# Patient Record
Sex: Female | Born: 1950 | Race: White | Hispanic: No | Marital: Married | Smoking: Never smoker
Health system: Southern US, Community
[De-identification: ages and names within clinical notes are randomized; demographics above are authoritative.]

## PROBLEM LIST (undated history)

## (undated) DIAGNOSIS — M109 Gout, unspecified: Secondary | ICD-10-CM

## (undated) DIAGNOSIS — E119 Type 2 diabetes mellitus without complications: Secondary | ICD-10-CM

## (undated) DIAGNOSIS — R112 Nausea with vomiting, unspecified: Secondary | ICD-10-CM

## (undated) DIAGNOSIS — I10 Essential (primary) hypertension: Secondary | ICD-10-CM

## (undated) DIAGNOSIS — T8859XA Other complications of anesthesia, initial encounter: Secondary | ICD-10-CM

## (undated) DIAGNOSIS — K429 Umbilical hernia without obstruction or gangrene: Secondary | ICD-10-CM

## (undated) DIAGNOSIS — T4145XA Adverse effect of unspecified anesthetic, initial encounter: Secondary | ICD-10-CM

## (undated) DIAGNOSIS — Z9889 Other specified postprocedural states: Secondary | ICD-10-CM

## (undated) HISTORY — PX: CYST EXCISION: SHX5701

## (undated) HISTORY — PX: ABDOMINAL HYSTERECTOMY: SHX81

## (undated) HISTORY — PX: APPENDECTOMY: SHX54

## (undated) HISTORY — PX: TONSILLECTOMY: SUR1361

---

## 2010-04-17 ENCOUNTER — Ambulatory Visit: Payer: Self-pay | Admitting: Family Medicine

## 2010-06-10 ENCOUNTER — Ambulatory Visit: Payer: Self-pay | Admitting: Gastroenterology

## 2010-11-19 ENCOUNTER — Ambulatory Visit: Payer: Self-pay | Admitting: Family Medicine

## 2010-11-19 ENCOUNTER — Ambulatory Visit: Payer: Self-pay | Admitting: Surgery

## 2010-11-21 LAB — PATHOLOGY REPORT

## 2011-05-25 ENCOUNTER — Ambulatory Visit: Payer: Self-pay | Admitting: Unknown Physician Specialty

## 2011-10-14 ENCOUNTER — Ambulatory Visit: Payer: Self-pay | Admitting: Unknown Physician Specialty

## 2013-03-01 ENCOUNTER — Ambulatory Visit: Payer: Self-pay | Admitting: Neurology

## 2013-03-01 LAB — CREATININE, SERUM: EGFR (Non-African Amer.): >60

## 2015-05-22 ENCOUNTER — Other Ambulatory Visit: Payer: Self-pay | Admitting: Family Medicine

## 2015-05-23 ENCOUNTER — Other Ambulatory Visit: Payer: Self-pay | Admitting: Family Medicine

## 2015-05-23 DIAGNOSIS — R1032 Left lower quadrant pain: Secondary | ICD-10-CM

## 2015-05-28 ENCOUNTER — Ambulatory Visit
Admission: RE | Admit: 2015-05-28 | Discharge: 2015-05-28 | Disposition: A | Discharge status: Home / Self Care | Payer: BLUE CROSS/BLUE SHIELD | Source: Ambulatory Visit | Attending: Family Medicine | Admitting: Family Medicine

## 2015-05-28 DIAGNOSIS — K429 Umbilical hernia without obstruction or gangrene: Secondary | ICD-10-CM | POA: Insufficient documentation

## 2015-05-28 DIAGNOSIS — K439 Ventral hernia without obstruction or gangrene: Secondary | ICD-10-CM | POA: Insufficient documentation

## 2015-05-28 DIAGNOSIS — K5730 Diverticulosis of large intestine without perforation or abscess without bleeding: Secondary | ICD-10-CM | POA: Insufficient documentation

## 2015-05-28 DIAGNOSIS — R1032 Left lower quadrant pain: Secondary | ICD-10-CM | POA: Diagnosis present

## 2015-05-28 HISTORY — DX: Type 2 diabetes mellitus without complications: E11.9

## 2015-05-28 HISTORY — DX: Essential (primary) hypertension: I10

## 2015-05-28 MED ORDER — IOHEXOL 300 MG/ML  SOLN
100.0000 mL | Freq: Once | INTRAMUSCULAR | Status: AC | PRN
  Administered 2015-05-28: 100 mL via INTRAVENOUS

## 2015-06-03 HISTORY — PX: CATARACT EXTRACTION: SUR2

## 2015-06-28 ENCOUNTER — Encounter: Payer: Self-pay | Admitting: *Deleted

## 2015-07-01 NOTE — Discharge Instructions (Signed)

## 2015-07-02 ENCOUNTER — Ambulatory Visit: Payer: BLUE CROSS/BLUE SHIELD | Admitting: Anesthesiology

## 2015-07-02 ENCOUNTER — Encounter
Admission: RE | Disposition: A | Discharge status: Home / Self Care | Payer: Self-pay | Source: Ambulatory Visit | Attending: Gastroenterology

## 2015-07-02 ENCOUNTER — Ambulatory Visit
Admission: RE | Admit: 2015-07-02 | Discharge: 2015-07-02 | Disposition: A | Discharge status: Home / Self Care | Payer: BLUE CROSS/BLUE SHIELD | Source: Ambulatory Visit | Attending: Gastroenterology | Admitting: Gastroenterology

## 2015-07-02 DIAGNOSIS — Z09 Encounter for follow-up examination after completed treatment for conditions other than malignant neoplasm: Secondary | ICD-10-CM | POA: Insufficient documentation

## 2015-07-02 DIAGNOSIS — E119 Type 2 diabetes mellitus without complications: Secondary | ICD-10-CM | POA: Insufficient documentation

## 2015-07-02 DIAGNOSIS — K5730 Diverticulosis of large intestine without perforation or abscess without bleeding: Secondary | ICD-10-CM | POA: Insufficient documentation

## 2015-07-02 DIAGNOSIS — Z8371 Family history of colonic polyps: Secondary | ICD-10-CM | POA: Diagnosis not present

## 2015-07-02 DIAGNOSIS — I10 Essential (primary) hypertension: Secondary | ICD-10-CM | POA: Diagnosis not present

## 2015-07-02 DIAGNOSIS — Z86010 Personal history of colonic polyps: Secondary | ICD-10-CM | POA: Diagnosis not present

## 2015-07-02 HISTORY — PX: COLONOSCOPY: SHX5424

## 2015-07-02 HISTORY — DX: Adverse effect of unspecified anesthetic, initial encounter: T41.45XA

## 2015-07-02 HISTORY — DX: Other complications of anesthesia, initial encounter: T88.59XA

## 2015-07-02 HISTORY — DX: Nausea with vomiting, unspecified: R11.2

## 2015-07-02 HISTORY — DX: Other specified postprocedural states: Z98.890

## 2015-07-02 HISTORY — DX: Umbilical hernia without obstruction or gangrene: K42.9

## 2015-07-02 HISTORY — DX: Gout, unspecified: M10.9

## 2015-07-02 LAB — GLUCOSE, CAPILLARY
GLUCOSE-CAPILLARY: 188 mg/dL — AB (ref 65–99)
GLUCOSE-CAPILLARY: 154 mg/dL — AB (ref 65–99)

## 2015-07-02 SURGERY — COLONOSCOPY
Anesthesia: Monitor Anesthesia Care | Wound class: Contaminated

## 2015-07-02 MED ORDER — LIDOCAINE HCL (CARDIAC) 20 MG/ML IV SOLN
INTRAVENOUS | Status: DC | PRN
  Administered 2015-07-02: 50 mg via INTRAVENOUS

## 2015-07-02 MED ORDER — ACETAMINOPHEN 325 MG PO TABS: 325.0000 mg | ORAL_TABLET | ORAL | Status: DC | PRN

## 2015-07-02 MED ORDER — ACETAMINOPHEN 160 MG/5ML PO SOLN: 325.0000 mg | ORAL | Status: DC | PRN

## 2015-07-02 MED ORDER — STERILE WATER FOR IRRIGATION IR SOLN
Status: DC | PRN
  Administered 2015-07-02: 10:00:00

## 2015-07-02 MED ORDER — LACTATED RINGERS IV SOLN
INTRAVENOUS | Status: DC
  Administered 2015-07-02: 09:00:00 via INTRAVENOUS

## 2015-07-02 MED ORDER — PROPOFOL 10 MG/ML IV BOLUS
INTRAVENOUS | Status: DC | PRN
  Administered 2015-07-02: 60 mg via INTRAVENOUS
  Administered 2015-07-02: 30 mg via INTRAVENOUS
  Administered 2015-07-02 (×2): 20 mg via INTRAVENOUS
  Administered 2015-07-02: 30 mg via INTRAVENOUS
  Administered 2015-07-02: 10 mg via INTRAVENOUS
  Administered 2015-07-02: 20 mg via INTRAVENOUS

## 2015-07-02 SURGICAL SUPPLY — 30 items

## 2015-07-02 NOTE — H&P (Signed)
  Date of Initial H&P:06/27/2015  History reviewed, patient examined, no change in status, stable for surgery.

## 2015-07-02 NOTE — Anesthesia Procedure Notes (Signed)
Procedure Name: MAC Performed by: Khing Belcher Pre-anesthesia Checklist: Patient identified, Emergency Drugs available, Suction available, Timeout performed and Patient being monitored Patient Re-evaluated:Patient Re-evaluated prior to inductionOxygen Delivery Method: Nasal cannula Placement Confirmation: positive ETCO2       

## 2015-07-02 NOTE — Op Note (Signed)
[facility]enter[facility] Patient Name: Laura Rose Procedure Date: 07/02/2015 9:43 AM MRN: [id number] Account #: [id number] Date of Birth: 12-Nov-1950 Admit Type: Outpatient Age: 64 Room: [facility] OR ROOM 01 Gender: Female Note Status: Finalized Procedure:         Colonoscopy Indications:       Family history of colonic polyps in a first-degree                     relative, Personal history of colonic polyps Providers:         Ezzard Standing. Bluford Kaufmann, MD Referring MD:      Marina Goodell (Referring MD) Medicines:         Monitored Anesthesia Care Complications:     No immediate complications. Procedure:         Pre-Anesthesia Assessment:                    - Prior to the procedure, a History and Physical was                     performed, and patient medications, allergies and                     sensitivities were reviewed. The patient's tolerance of                     previous anesthesia was reviewed.                    - The risks and benefits of the procedure and the sedation                     options and risks were discussed with the patient. All                     questions were answered and informed consent was obtained.                    - After reviewing the risks and benefits, the patient was                     deemed in satisfactory condition to undergo the procedure.                    After obtaining informed consent, the colonoscope was                     passed under direct vision. Throughout the procedure, the                     patient's blood pressure, pulse, and oxygen saturations                     were monitored continuously. The Olympus CF-HQ190L                     Colonoscope (S#. [id number]) was introduced through the anus                     and advanced to the the cecum, identified by appendiceal                     orifice and ileocecal valve. The colonoscopy was performed  without difficulty. The patient tolerated the  procedure                     well. The quality of the bowel preparation was good. Findings:      A few small and large-mouthed diverticula were found in the sigmoid       colon.      The exam was otherwise without abnormality. Impression:        - Diverticulosis in the sigmoid colon.                    - The examination was otherwise normal.                    - No specimens collected. Recommendation:    - Discharge patient to home.                    - Repeat colonoscopy in 5 years for surveillance.                    - The findings and recommendations were discussed with the                     patient. Procedure Code(s): --- Professional ---                    [id number], Colonoscopy, flexible; diagnostic, including                     collection of specimen(s) by brushing or washing, when                     performed (separate procedure) Diagnosis Code(s): --- Professional ---                    Z83.71, Family history of colonic polyps                    Z86.010, Personal history of colonic polyps                    K57.30, Diverticulosis of large intestine without                     perforation or abscess without bleeding CPT copyright 2014 [employer]. All rights reserved. The codes documented in this report are preliminary and upon coder review may  be revised to meet current compliance requirements. Wallace Cullens, MD 07/02/2015 10:09:56 AM This report has been signed electronically. Number of Addenda: 0 Note Initiated On: 07/02/2015 9:43 AM Scope Withdrawal Time: 0 hours 7 minutes 21 seconds  Total Procedure Duration: 0 hours 10 minutes 16 seconds       [facility]

## 2015-07-02 NOTE — Anesthesia Preprocedure Evaluation (Signed)
Anesthesia Evaluation  Patient identified by MRN, date of birth, ID band  Reviewed: Allergy & Precautions, H&P , NPO status , Patient's Chart, lab work & pertinent test results  History of Anesthesia Complications (+) PONV and history of anesthetic complications  Airway Mallampati: III  TM Distance: >3 FB Neck ROM: full    Dental no notable dental hx.    Pulmonary    Pulmonary exam normal       Cardiovascular hypertension, Rhythm:regular Rate:Normal     Neuro/Psych    GI/Hepatic   Endo/Other  diabetes  Renal/GU      Musculoskeletal   Abdominal   Peds  Hematology   Anesthesia Other Findings   Reproductive/Obstetrics                             Anesthesia Physical Anesthesia Plan  ASA: II  Anesthesia Plan: MAC   Post-op Pain Management:    Induction:   Airway Management Planned:   Additional Equipment:   Intra-op Plan:   Post-operative Plan:   Informed Consent: I have reviewed the patients History and Physical, chart, labs and discussed the procedure including the risks, benefits and alternatives for the proposed anesthesia with the patient or authorized representative who has indicated his/her understanding and acceptance.     Plan Discussed with: CRNA  Anesthesia Plan Comments:         Anesthesia Quick Evaluation

## 2015-07-02 NOTE — Transfer of Care (Signed)
Immediate Anesthesia Transfer of Care Note  Patient: Laura Rose  Procedure(s) Performed: Procedure(s) with comments: COLONOSCOPY  (N/A) - Diabetic - oral meds  Patient Location: PACU  Anesthesia Type: MAC  Level of Consciousness: awake, alert  and patient cooperative  Airway and Oxygen Therapy: Patient Spontanous Breathing and Patient connected to supplemental oxygen  Post-op Assessment: Post-op Vital signs reviewed, Patient's Cardiovascular Status Stable, Respiratory Function Stable, Patent Airway and No signs of Nausea or vomiting  Post-op Vital Signs: Reviewed and stable  Complications: No apparent anesthesia complications

## 2015-07-02 NOTE — Anesthesia Postprocedure Evaluation (Signed)
  Anesthesia Post-op Note  Patient: Laura Rose  Procedure(s) Performed: Procedure(s) with comments: COLONOSCOPY  (N/A) - Diabetic - oral meds  Anesthesia type:MAC  Patient location: PACU  Post pain: Pain level controlled  Post assessment: Post-op Vital signs reviewed, Patient's Cardiovascular Status Stable, Respiratory Function Stable, Patent Airway and No signs of Nausea or vomiting  Post vital signs: Reviewed and stable  Last Vitals:  Filed Vitals:   07/02/15 1030  BP: 131/57  Pulse: 67  Temp:   Resp: 14    Level of consciousness: awake, alert  and patient cooperative  Complications: No apparent anesthesia complications

## 2015-07-03 ENCOUNTER — Encounter: Payer: Self-pay | Admitting: Gastroenterology

## 2017-06-03 ENCOUNTER — Other Ambulatory Visit: Payer: Self-pay | Admitting: Family Medicine

## 2017-06-03 DIAGNOSIS — Z1231 Encounter for screening mammogram for malignant neoplasm of breast: Secondary | ICD-10-CM

## 2018-10-06 ENCOUNTER — Other Ambulatory Visit: Payer: Self-pay | Admitting: Family Medicine

## 2018-10-06 DIAGNOSIS — Z1231 Encounter for screening mammogram for malignant neoplasm of breast: Secondary | ICD-10-CM

## 2018-10-27 ENCOUNTER — Ambulatory Visit
Admission: RE | Admit: 2018-10-27 | Discharge: 2018-10-27 | Disposition: A | Discharge status: Home / Self Care | Payer: Medicare Other | Source: Ambulatory Visit | Attending: Family Medicine | Admitting: Family Medicine

## 2018-10-27 ENCOUNTER — Encounter (INDEPENDENT_AMBULATORY_CARE_PROVIDER_SITE_OTHER): Payer: Self-pay

## 2018-10-27 DIAGNOSIS — Z1231 Encounter for screening mammogram for malignant neoplasm of breast: Secondary | ICD-10-CM | POA: Diagnosis present

## 2018-10-31 ENCOUNTER — Other Ambulatory Visit: Payer: Self-pay | Admitting: Family Medicine

## 2018-10-31 DIAGNOSIS — R921 Mammographic calcification found on diagnostic imaging of breast: Secondary | ICD-10-CM

## 2018-10-31 DIAGNOSIS — R928 Other abnormal and inconclusive findings on diagnostic imaging of breast: Secondary | ICD-10-CM

## 2018-11-04 ENCOUNTER — Ambulatory Visit
Admission: RE | Admit: 2018-11-04 | Discharge: 2018-11-04 | Disposition: A | Discharge status: Home / Self Care | Payer: Medicare Other | Source: Ambulatory Visit | Attending: Family Medicine | Admitting: Family Medicine

## 2018-11-04 DIAGNOSIS — R928 Other abnormal and inconclusive findings on diagnostic imaging of breast: Secondary | ICD-10-CM

## 2018-11-04 DIAGNOSIS — R921 Mammographic calcification found on diagnostic imaging of breast: Secondary | ICD-10-CM | POA: Diagnosis present

## 2018-11-07 ENCOUNTER — Other Ambulatory Visit: Payer: Self-pay | Admitting: Family Medicine

## 2018-11-07 DIAGNOSIS — R928 Other abnormal and inconclusive findings on diagnostic imaging of breast: Secondary | ICD-10-CM

## 2018-11-07 DIAGNOSIS — R921 Mammographic calcification found on diagnostic imaging of breast: Secondary | ICD-10-CM

## 2019-10-09 ENCOUNTER — Other Ambulatory Visit: Payer: Self-pay | Admitting: Family Medicine

## 2019-10-09 DIAGNOSIS — R921 Mammographic calcification found on diagnostic imaging of breast: Secondary | ICD-10-CM

## 2020-12-24 IMAGING — MG DIGITAL DIAGNOSTIC UNILATERAL LEFT MAMMOGRAM
4 series · 4 of 8 positions shown · non-contrast
Comparison: Previous exam(s).

CLINICAL DATA: Left breast calcifications seen on most recent
screening mammography.

EXAM:
DIGITAL DIAGNOSTIC LEFT MAMMOGRAM WITH CAD

[L ML]
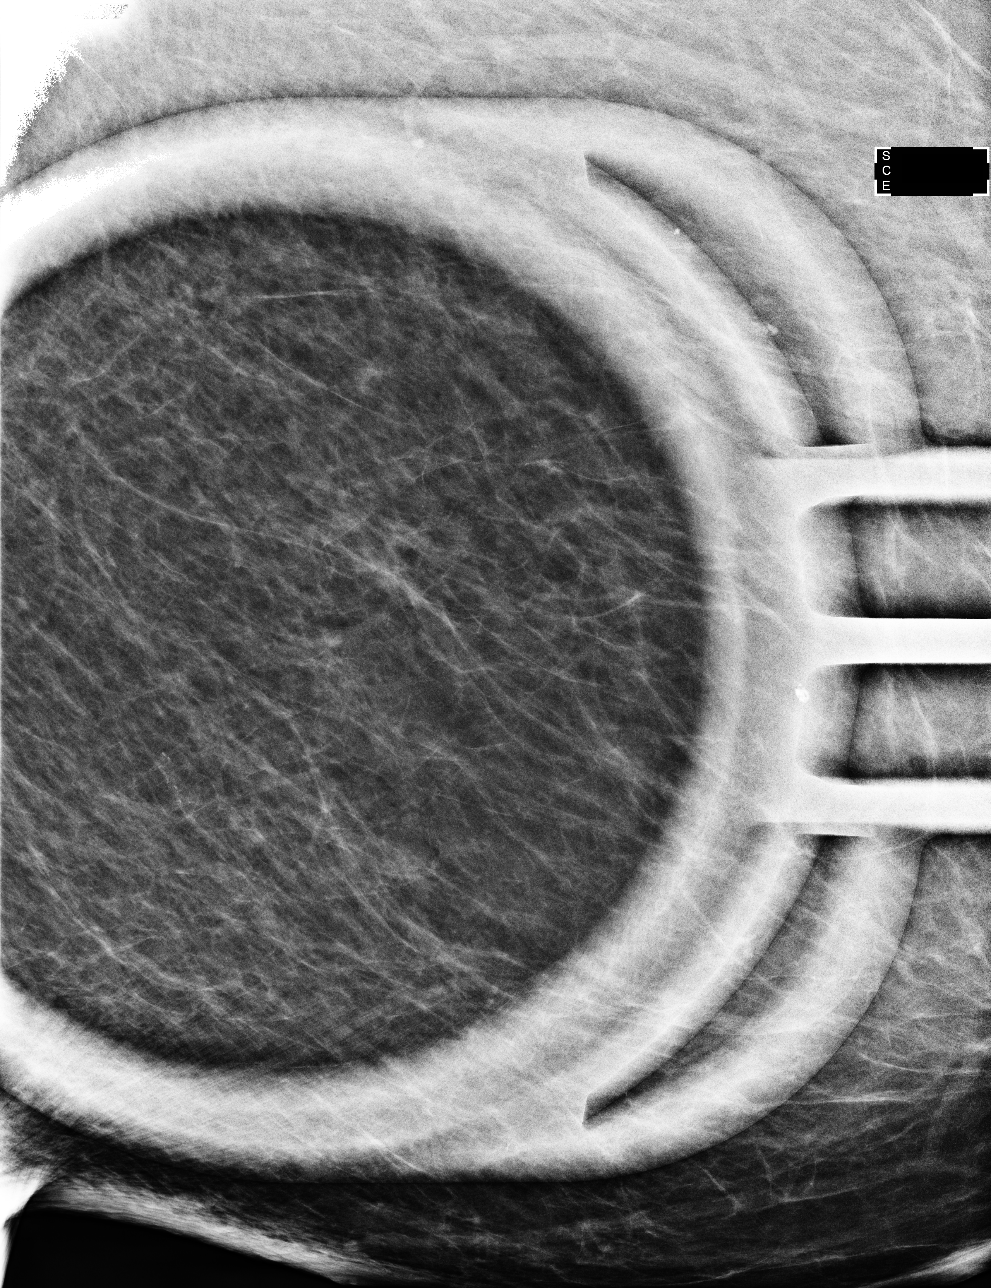

[L CC]
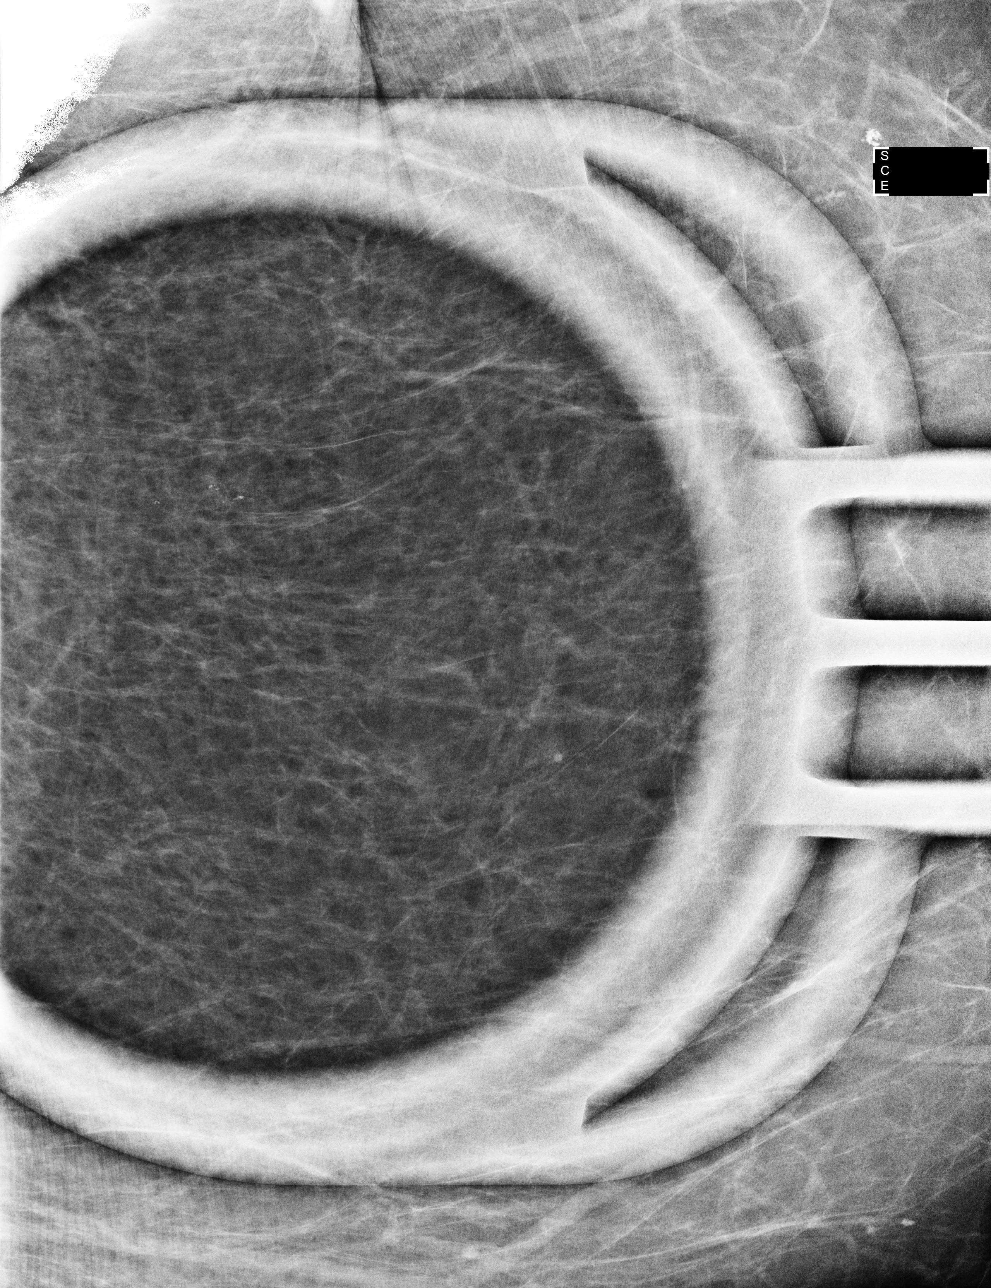

[L ML synth-2D]
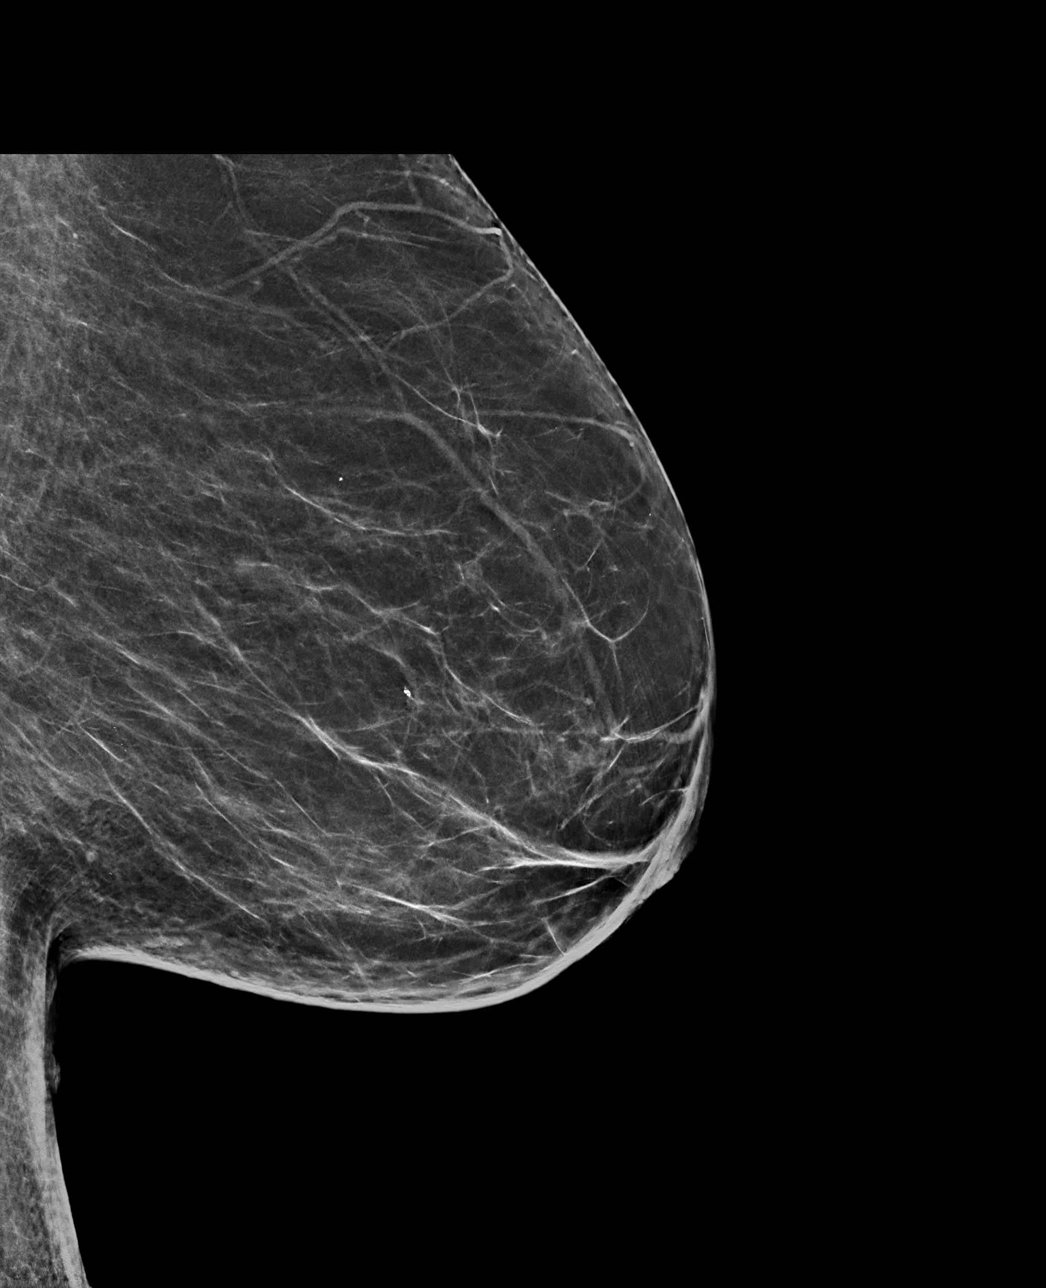

[L ML tomo · tomo slice 33/66.0]
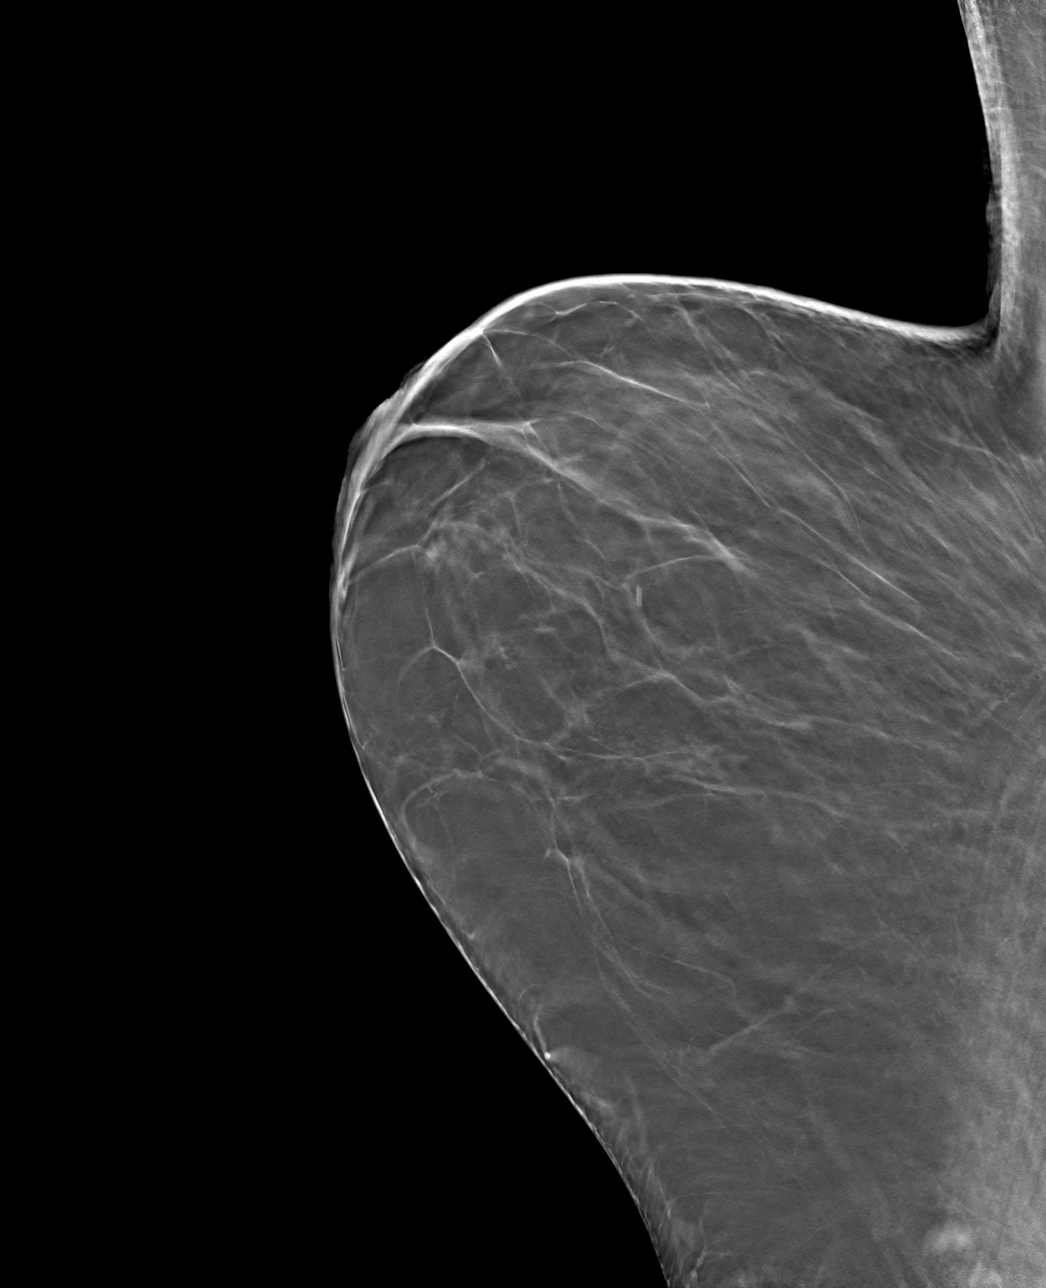

[4 of 8 positions shown; findings below may reference images not displayed]

ACR Breast Density Category b: There are scattered areas of
fibroglandular density.
FINDINGS: Additional mammographic views of the left breast demonstrate a group
of probably benign calcifications in the lower central left breast,
posterior depth, which measures 6 x 2 x 4 mm. These could
potentially represent vascular calcifications. There is no
associated mass.

Mammographic images were processed with CAD.
IMPRESSION: Left breast probably benign calcifications, for which short-term
follow-up is recommended.

RECOMMENDATION:
Diagnostic mammogram of the left breast in 6 months. (Code:[id number]-[id number])

I have discussed the findings and recommendations with the patient.
Results were also provided in writing at the conclusion of the
visit. If applicable, a reminder letter will be sent to the patient
regarding the next appointment.

BI-RADS CATEGORY  3: Probably benign.

## 2022-05-12 DIAGNOSIS — H1033 Unspecified acute conjunctivitis, bilateral: Secondary | ICD-10-CM | POA: Diagnosis not present

## 2022-05-12 DIAGNOSIS — H6122 Impacted cerumen, left ear: Secondary | ICD-10-CM | POA: Diagnosis not present

## 2022-05-12 DIAGNOSIS — H938X2 Other specified disorders of left ear: Secondary | ICD-10-CM | POA: Diagnosis not present

## 2022-05-12 DIAGNOSIS — J019 Acute sinusitis, unspecified: Secondary | ICD-10-CM | POA: Diagnosis not present

## 2022-05-12 DIAGNOSIS — J029 Acute pharyngitis, unspecified: Secondary | ICD-10-CM | POA: Diagnosis not present

## 2022-06-23 DIAGNOSIS — Z1389 Encounter for screening for other disorder: Secondary | ICD-10-CM | POA: Diagnosis not present

## 2022-06-23 DIAGNOSIS — M109 Gout, unspecified: Secondary | ICD-10-CM | POA: Diagnosis not present

## 2022-06-23 DIAGNOSIS — Z0000 Encounter for general adult medical examination without abnormal findings: Secondary | ICD-10-CM | POA: Diagnosis not present

## 2022-06-23 DIAGNOSIS — Z6835 Body mass index (BMI) 35.0-35.9, adult: Secondary | ICD-10-CM | POA: Diagnosis not present

## 2022-06-23 DIAGNOSIS — E785 Hyperlipidemia, unspecified: Secondary | ICD-10-CM | POA: Diagnosis not present

## 2022-06-23 DIAGNOSIS — I10 Essential (primary) hypertension: Secondary | ICD-10-CM | POA: Diagnosis not present

## 2022-06-23 DIAGNOSIS — E119 Type 2 diabetes mellitus without complications: Secondary | ICD-10-CM | POA: Diagnosis not present

## 2022-06-23 DIAGNOSIS — F3341 Major depressive disorder, recurrent, in partial remission: Secondary | ICD-10-CM | POA: Diagnosis not present

## 2022-07-30 DIAGNOSIS — D485 Neoplasm of uncertain behavior of skin: Secondary | ICD-10-CM | POA: Diagnosis not present

## 2022-07-30 DIAGNOSIS — D224 Melanocytic nevi of scalp and neck: Secondary | ICD-10-CM | POA: Diagnosis not present

## 2022-07-30 DIAGNOSIS — L821 Other seborrheic keratosis: Secondary | ICD-10-CM | POA: Diagnosis not present

## 2022-07-30 DIAGNOSIS — Z872 Personal history of diseases of the skin and subcutaneous tissue: Secondary | ICD-10-CM | POA: Diagnosis not present

## 2022-07-30 DIAGNOSIS — L578 Other skin changes due to chronic exposure to nonionizing radiation: Secondary | ICD-10-CM | POA: Diagnosis not present

## 2022-07-30 DIAGNOSIS — Z86018 Personal history of other benign neoplasm: Secondary | ICD-10-CM | POA: Diagnosis not present

## 2022-07-30 DIAGNOSIS — D225 Melanocytic nevi of trunk: Secondary | ICD-10-CM | POA: Diagnosis not present

## 2022-08-10 DIAGNOSIS — H4423 Degenerative myopia, bilateral: Secondary | ICD-10-CM | POA: Diagnosis not present

## 2022-08-10 DIAGNOSIS — H26493 Other secondary cataract, bilateral: Secondary | ICD-10-CM | POA: Diagnosis not present

## 2022-08-10 DIAGNOSIS — H40012 Open angle with borderline findings, low risk, left eye: Secondary | ICD-10-CM | POA: Diagnosis not present

## 2022-08-10 DIAGNOSIS — E119 Type 2 diabetes mellitus without complications: Secondary | ICD-10-CM | POA: Diagnosis not present

## 2022-09-02 DIAGNOSIS — D235 Other benign neoplasm of skin of trunk: Secondary | ICD-10-CM | POA: Diagnosis not present

## 2022-09-02 DIAGNOSIS — L988 Other specified disorders of the skin and subcutaneous tissue: Secondary | ICD-10-CM | POA: Diagnosis not present

## 2022-09-16 DIAGNOSIS — D234 Other benign neoplasm of skin of scalp and neck: Secondary | ICD-10-CM | POA: Diagnosis not present

## 2022-09-16 DIAGNOSIS — D224 Melanocytic nevi of scalp and neck: Secondary | ICD-10-CM | POA: Diagnosis not present

## 2022-10-14 DIAGNOSIS — F3341 Major depressive disorder, recurrent, in partial remission: Secondary | ICD-10-CM | POA: Diagnosis not present

## 2022-10-14 DIAGNOSIS — M109 Gout, unspecified: Secondary | ICD-10-CM | POA: Diagnosis not present

## 2022-10-14 DIAGNOSIS — E119 Type 2 diabetes mellitus without complications: Secondary | ICD-10-CM | POA: Diagnosis not present

## 2022-10-14 DIAGNOSIS — I10 Essential (primary) hypertension: Secondary | ICD-10-CM | POA: Diagnosis not present

## 2022-10-14 DIAGNOSIS — E785 Hyperlipidemia, unspecified: Secondary | ICD-10-CM | POA: Diagnosis not present

## 2022-12-31 DIAGNOSIS — Z6835 Body mass index (BMI) 35.0-35.9, adult: Secondary | ICD-10-CM | POA: Diagnosis not present

## 2022-12-31 DIAGNOSIS — E785 Hyperlipidemia, unspecified: Secondary | ICD-10-CM | POA: Diagnosis not present

## 2022-12-31 DIAGNOSIS — I10 Essential (primary) hypertension: Secondary | ICD-10-CM | POA: Diagnosis not present

## 2022-12-31 DIAGNOSIS — F3341 Major depressive disorder, recurrent, in partial remission: Secondary | ICD-10-CM | POA: Diagnosis not present

## 2022-12-31 DIAGNOSIS — M109 Gout, unspecified: Secondary | ICD-10-CM | POA: Diagnosis not present

## 2022-12-31 DIAGNOSIS — E119 Type 2 diabetes mellitus without complications: Secondary | ICD-10-CM | POA: Diagnosis not present

## 2023-01-26 DIAGNOSIS — M109 Gout, unspecified: Secondary | ICD-10-CM | POA: Diagnosis not present

## 2023-01-26 DIAGNOSIS — E119 Type 2 diabetes mellitus without complications: Secondary | ICD-10-CM | POA: Diagnosis not present

## 2023-01-26 DIAGNOSIS — E785 Hyperlipidemia, unspecified: Secondary | ICD-10-CM | POA: Diagnosis not present

## 2023-01-26 DIAGNOSIS — I10 Essential (primary) hypertension: Secondary | ICD-10-CM | POA: Diagnosis not present

## 2023-01-26 DIAGNOSIS — F3341 Major depressive disorder, recurrent, in partial remission: Secondary | ICD-10-CM | POA: Diagnosis not present

## 2023-03-24 DIAGNOSIS — L578 Other skin changes due to chronic exposure to nonionizing radiation: Secondary | ICD-10-CM | POA: Diagnosis not present

## 2023-03-24 DIAGNOSIS — L570 Actinic keratosis: Secondary | ICD-10-CM | POA: Diagnosis not present

## 2023-03-24 DIAGNOSIS — Z872 Personal history of diseases of the skin and subcutaneous tissue: Secondary | ICD-10-CM | POA: Diagnosis not present

## 2023-03-24 DIAGNOSIS — Z86018 Personal history of other benign neoplasm: Secondary | ICD-10-CM | POA: Diagnosis not present

## 2023-07-19 DIAGNOSIS — Z0000 Encounter for general adult medical examination without abnormal findings: Secondary | ICD-10-CM | POA: Diagnosis not present

## 2023-07-19 DIAGNOSIS — E785 Hyperlipidemia, unspecified: Secondary | ICD-10-CM | POA: Diagnosis not present

## 2023-07-19 DIAGNOSIS — M109 Gout, unspecified: Secondary | ICD-10-CM | POA: Diagnosis not present

## 2023-07-19 DIAGNOSIS — E119 Type 2 diabetes mellitus without complications: Secondary | ICD-10-CM | POA: Diagnosis not present

## 2023-07-19 DIAGNOSIS — Z1331 Encounter for screening for depression: Secondary | ICD-10-CM | POA: Diagnosis not present

## 2023-07-19 DIAGNOSIS — I10 Essential (primary) hypertension: Secondary | ICD-10-CM | POA: Diagnosis not present

## 2023-07-19 DIAGNOSIS — F3341 Major depressive disorder, recurrent, in partial remission: Secondary | ICD-10-CM | POA: Diagnosis not present

## 2023-07-20 ENCOUNTER — Ambulatory Visit: Payer: Medicare PPO

## 2023-07-20 DIAGNOSIS — Z23 Encounter for immunization: Secondary | ICD-10-CM

## 2023-07-20 NOTE — Progress Notes (Signed)
Pt seen in nurse clinic with Husband for high dose flu and Pfizer vaccine. Eligible, administered Fluzone high dose and Pfizer Comirnaty 12y+, yr 2024-2025. Monitored for 15 min without problems. Given VIS and NCIR copy, explained and understood. M.Almin Livingstone, LPN.

## 2023-07-28 DIAGNOSIS — D2261 Melanocytic nevi of right upper limb, including shoulder: Secondary | ICD-10-CM | POA: Diagnosis not present

## 2023-07-28 DIAGNOSIS — D485 Neoplasm of uncertain behavior of skin: Secondary | ICD-10-CM | POA: Diagnosis not present

## 2023-07-28 DIAGNOSIS — Z872 Personal history of diseases of the skin and subcutaneous tissue: Secondary | ICD-10-CM | POA: Diagnosis not present

## 2023-07-28 DIAGNOSIS — L578 Other skin changes due to chronic exposure to nonionizing radiation: Secondary | ICD-10-CM | POA: Diagnosis not present

## 2023-07-28 DIAGNOSIS — Z86018 Personal history of other benign neoplasm: Secondary | ICD-10-CM | POA: Diagnosis not present

## 2023-07-28 DIAGNOSIS — D224 Melanocytic nevi of scalp and neck: Secondary | ICD-10-CM | POA: Diagnosis not present

## 2024-01-25 DIAGNOSIS — M109 Gout, unspecified: Secondary | ICD-10-CM | POA: Diagnosis not present

## 2024-01-25 DIAGNOSIS — M5442 Lumbago with sciatica, left side: Secondary | ICD-10-CM | POA: Diagnosis not present

## 2024-01-25 DIAGNOSIS — F3341 Major depressive disorder, recurrent, in partial remission: Secondary | ICD-10-CM | POA: Diagnosis not present

## 2024-01-25 DIAGNOSIS — Z6836 Body mass index (BMI) 36.0-36.9, adult: Secondary | ICD-10-CM | POA: Diagnosis not present

## 2024-01-25 DIAGNOSIS — E785 Hyperlipidemia, unspecified: Secondary | ICD-10-CM | POA: Diagnosis not present

## 2024-01-25 DIAGNOSIS — E119 Type 2 diabetes mellitus without complications: Secondary | ICD-10-CM | POA: Diagnosis not present

## 2024-01-25 DIAGNOSIS — M5441 Lumbago with sciatica, right side: Secondary | ICD-10-CM | POA: Diagnosis not present

## 2024-01-25 DIAGNOSIS — I10 Essential (primary) hypertension: Secondary | ICD-10-CM | POA: Diagnosis not present

## 2024-03-20 DIAGNOSIS — M7062 Trochanteric bursitis, left hip: Secondary | ICD-10-CM | POA: Diagnosis not present

## 2024-03-20 DIAGNOSIS — M5442 Lumbago with sciatica, left side: Secondary | ICD-10-CM | POA: Diagnosis not present

## 2024-03-20 DIAGNOSIS — Z1331 Encounter for screening for depression: Secondary | ICD-10-CM | POA: Diagnosis not present

## 2024-03-20 DIAGNOSIS — M5441 Lumbago with sciatica, right side: Secondary | ICD-10-CM | POA: Diagnosis not present

## 2024-03-20 DIAGNOSIS — M7061 Trochanteric bursitis, right hip: Secondary | ICD-10-CM | POA: Diagnosis not present

## 2024-04-25 DIAGNOSIS — F3341 Major depressive disorder, recurrent, in partial remission: Secondary | ICD-10-CM | POA: Diagnosis not present

## 2024-04-25 DIAGNOSIS — E119 Type 2 diabetes mellitus without complications: Secondary | ICD-10-CM | POA: Diagnosis not present

## 2024-04-25 DIAGNOSIS — E785 Hyperlipidemia, unspecified: Secondary | ICD-10-CM | POA: Diagnosis not present

## 2024-05-08 DIAGNOSIS — M25561 Pain in right knee: Secondary | ICD-10-CM | POA: Diagnosis not present

## 2024-05-08 DIAGNOSIS — M5416 Radiculopathy, lumbar region: Secondary | ICD-10-CM | POA: Diagnosis not present

## 2024-05-08 DIAGNOSIS — G8929 Other chronic pain: Secondary | ICD-10-CM | POA: Diagnosis not present

## 2024-05-08 DIAGNOSIS — M5442 Lumbago with sciatica, left side: Secondary | ICD-10-CM | POA: Diagnosis not present

## 2024-05-08 DIAGNOSIS — M25562 Pain in left knee: Secondary | ICD-10-CM | POA: Diagnosis not present

## 2024-05-08 DIAGNOSIS — M5441 Lumbago with sciatica, right side: Secondary | ICD-10-CM | POA: Diagnosis not present

## 2024-05-24 DIAGNOSIS — M170 Bilateral primary osteoarthritis of knee: Secondary | ICD-10-CM | POA: Diagnosis not present

## 2024-05-24 DIAGNOSIS — M25561 Pain in right knee: Secondary | ICD-10-CM | POA: Diagnosis not present

## 2024-05-24 DIAGNOSIS — M25562 Pain in left knee: Secondary | ICD-10-CM | POA: Diagnosis not present

## 2024-07-21 ENCOUNTER — Other Ambulatory Visit: Payer: Self-pay

## 2024-07-21 MED ORDER — FLUZONE HIGH-DOSE 0.5 ML IM SUSY
0.5000 mL | PREFILLED_SYRINGE | Freq: Once | INTRAMUSCULAR | 0 refills | Status: AC
  Filled 2024-07-21: qty 0.5, 1d supply, fill #0

## 2024-07-21 MED ORDER — COMIRNATY 30 MCG/0.3ML IM SUSY
0.3000 mL | PREFILLED_SYRINGE | Freq: Once | INTRAMUSCULAR | 0 refills | Status: AC
  Filled 2024-07-21: qty 0.3, 1d supply, fill #0

## 2024-07-26 DIAGNOSIS — E785 Hyperlipidemia, unspecified: Secondary | ICD-10-CM | POA: Diagnosis not present

## 2024-07-26 DIAGNOSIS — E119 Type 2 diabetes mellitus without complications: Secondary | ICD-10-CM | POA: Diagnosis not present

## 2024-07-26 DIAGNOSIS — I10 Essential (primary) hypertension: Secondary | ICD-10-CM | POA: Diagnosis not present

## 2024-07-27 DIAGNOSIS — L578 Other skin changes due to chronic exposure to nonionizing radiation: Secondary | ICD-10-CM | POA: Diagnosis not present

## 2024-07-27 DIAGNOSIS — Z872 Personal history of diseases of the skin and subcutaneous tissue: Secondary | ICD-10-CM | POA: Diagnosis not present

## 2024-07-27 DIAGNOSIS — Z86018 Personal history of other benign neoplasm: Secondary | ICD-10-CM | POA: Diagnosis not present

## 2024-08-28 DIAGNOSIS — E119 Type 2 diabetes mellitus without complications: Secondary | ICD-10-CM | POA: Diagnosis not present

## 2024-08-28 DIAGNOSIS — E785 Hyperlipidemia, unspecified: Secondary | ICD-10-CM | POA: Diagnosis not present

## 2024-08-28 DIAGNOSIS — F3341 Major depressive disorder, recurrent, in partial remission: Secondary | ICD-10-CM | POA: Diagnosis not present

## 2024-08-28 DIAGNOSIS — Z6836 Body mass index (BMI) 36.0-36.9, adult: Secondary | ICD-10-CM | POA: Diagnosis not present

## 2024-08-28 DIAGNOSIS — Z0000 Encounter for general adult medical examination without abnormal findings: Secondary | ICD-10-CM | POA: Diagnosis not present

## 2024-08-28 DIAGNOSIS — M109 Gout, unspecified: Secondary | ICD-10-CM | POA: Diagnosis not present

## 2024-08-28 DIAGNOSIS — I10 Essential (primary) hypertension: Secondary | ICD-10-CM | POA: Diagnosis not present

## 2024-08-28 DIAGNOSIS — M5442 Lumbago with sciatica, left side: Secondary | ICD-10-CM | POA: Diagnosis not present
